# Patient Record
Sex: Male | Born: 1990 | Race: White | Hispanic: No | Marital: Married | Smoking: Never smoker
Health system: Southern US, Community
[De-identification: ages and names within clinical notes are randomized; demographics above are authoritative.]

## PROBLEM LIST (undated history)

## (undated) HISTORY — PX: OTHER SURGICAL HISTORY: SHX169

---
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
## 2009-01-20 ENCOUNTER — Emergency Department (HOSPITAL_COMMUNITY): Admission: EM | Admit: 2009-01-20 | Discharge: 2009-01-20 | Payer: Self-pay | Admitting: Emergency Medicine

## 2009-09-24 ENCOUNTER — Emergency Department (HOSPITAL_COMMUNITY): Admission: EM | Admit: 2009-09-24 | Discharge: 2009-09-24 | Payer: Self-pay | Admitting: Emergency Medicine

## 2010-03-21 ENCOUNTER — Emergency Department (HOSPITAL_COMMUNITY): Admission: EM | Admit: 2010-03-21 | Discharge: 2010-03-21 | Payer: Self-pay | Admitting: Emergency Medicine

## 2010-05-07 ENCOUNTER — Emergency Department (HOSPITAL_COMMUNITY): Admission: EM | Admit: 2010-05-07 | Discharge: 2010-05-07 | Payer: Self-pay | Admitting: Emergency Medicine

## 2010-12-03 ENCOUNTER — Emergency Department (HOSPITAL_COMMUNITY)
Admission: EM | Admit: 2010-12-03 | Discharge: 2010-12-03 | Payer: Self-pay | Source: Home / Self Care | Admitting: Emergency Medicine

## 2011-01-24 ENCOUNTER — Ambulatory Visit (INDEPENDENT_AMBULATORY_CARE_PROVIDER_SITE_OTHER): Payer: Self-pay

## 2011-01-24 ENCOUNTER — Inpatient Hospital Stay (INDEPENDENT_AMBULATORY_CARE_PROVIDER_SITE_OTHER)
Admission: RE | Admit: 2011-01-24 | Discharge: 2011-01-24 | Disposition: A | Discharge status: Home / Self Care | Payer: Self-pay | Source: Ambulatory Visit | Attending: Emergency Medicine | Admitting: Emergency Medicine

## 2011-01-24 DIAGNOSIS — S93609A Unspecified sprain of unspecified foot, initial encounter: Secondary | ICD-10-CM

## 2011-02-17 LAB — RAPID STREP SCREEN (MED CTR MEBANE ONLY): Streptococcus, Group A Screen (Direct): NEGATIVE

## 2011-06-19 ENCOUNTER — Emergency Department (HOSPITAL_COMMUNITY): Payer: Self-pay

## 2011-06-19 ENCOUNTER — Emergency Department (HOSPITAL_COMMUNITY)
Admission: EM | Admit: 2011-06-19 | Discharge: 2011-06-19 | Disposition: A | Discharge status: Home / Self Care | Payer: Self-pay | Attending: Emergency Medicine | Admitting: Emergency Medicine

## 2011-06-19 DIAGNOSIS — W278XXA Contact with other nonpowered hand tool, initial encounter: Secondary | ICD-10-CM | POA: Insufficient documentation

## 2011-06-19 DIAGNOSIS — M79609 Pain in unspecified limb: Secondary | ICD-10-CM | POA: Insufficient documentation

## 2011-06-19 DIAGNOSIS — Z23 Encounter for immunization: Secondary | ICD-10-CM | POA: Insufficient documentation

## 2011-06-19 DIAGNOSIS — Y990 Civilian activity done for income or pay: Secondary | ICD-10-CM | POA: Insufficient documentation

## 2011-06-19 DIAGNOSIS — Y9269 Other specified industrial and construction area as the place of occurrence of the external cause: Secondary | ICD-10-CM | POA: Insufficient documentation

## 2011-06-19 DIAGNOSIS — S61409A Unspecified open wound of unspecified hand, initial encounter: Secondary | ICD-10-CM | POA: Insufficient documentation

## 2011-06-23 ENCOUNTER — Emergency Department (HOSPITAL_COMMUNITY)
Admission: EM | Admit: 2011-06-23 | Discharge: 2011-06-24 | Disposition: A | Discharge status: Home / Self Care | Payer: Worker's Compensation | Attending: Emergency Medicine | Admitting: Emergency Medicine

## 2011-06-23 DIAGNOSIS — M2550 Pain in unspecified joint: Secondary | ICD-10-CM | POA: Insufficient documentation

## 2011-09-28 ENCOUNTER — Emergency Department (HOSPITAL_COMMUNITY)
Admission: EM | Admit: 2011-09-28 | Discharge: 2011-09-28 | Disposition: A | Discharge status: Home / Self Care | Payer: Self-pay | Attending: Emergency Medicine | Admitting: Emergency Medicine

## 2011-09-28 ENCOUNTER — Encounter: Payer: Self-pay | Admitting: Emergency Medicine

## 2011-09-28 ENCOUNTER — Emergency Department (HOSPITAL_COMMUNITY): Payer: Self-pay

## 2011-09-28 DIAGNOSIS — B9789 Other viral agents as the cause of diseases classified elsewhere: Secondary | ICD-10-CM | POA: Insufficient documentation

## 2011-09-28 DIAGNOSIS — R070 Pain in throat: Secondary | ICD-10-CM | POA: Insufficient documentation

## 2011-09-28 DIAGNOSIS — J069 Acute upper respiratory infection, unspecified: Secondary | ICD-10-CM | POA: Insufficient documentation

## 2011-09-28 MED ORDER — IBUPROFEN 800 MG PO TABS: 800.0000 mg | ORAL_TABLET | Freq: Three times a day (TID) | ORAL | Status: DC

## 2011-09-28 MED ORDER — PREDNISONE 10 MG PO TABS: 20.0000 mg | ORAL_TABLET | Freq: Two times a day (BID) | ORAL | Status: AC

## 2011-09-28 MED ORDER — ALBUTEROL SULFATE HFA 108 (90 BASE) MCG/ACT IN AERS
2.0000 | INHALATION_SPRAY | Freq: Once | RESPIRATORY_TRACT | Status: AC
  Administered 2011-09-28: 2 via RESPIRATORY_TRACT
  Filled 2011-09-28: qty 6.7

## 2011-09-28 MED ORDER — IBUPROFEN 800 MG PO TABS: 800.0000 mg | ORAL_TABLET | Freq: Three times a day (TID) | ORAL | Status: AC

## 2011-09-28 MED ORDER — PREDNISONE 10 MG PO TABS: 20.0000 mg | ORAL_TABLET | Freq: Two times a day (BID) | ORAL | Status: DC

## 2011-09-28 NOTE — ED Notes (Signed)
Pt states he was told by a "[facility] physician" that his "tonsils were deteriorating". C/o throat pain x 1 week.

## 2011-09-28 NOTE — ED Notes (Signed)
Pt states sore throat started about 1 and 1/2 weeks ago along with light headedness and cough.

## 2011-09-28 NOTE — ED Provider Notes (Signed)
History     CSN: [id number] Arrival date & time: 09/28/2011  6:52 PM   First MD Initiated Contact with Patient 09/28/11 1942      Chief Complaint  Patient presents with  . Sore Throat    (Consider location/radiation/quality/duration/timing/severity/associated sxs/prior treatment) HPI History provided by pt.   Pt c/o sore throat and productive cough x 1.5wks.  Cough improved but then worsened again.  Small streak of blood in sputum today.  Has also had chills and bodyaches.  Denies nasal congestion and rhinorrhea.  No known sick contacts.  Seems to have the same sx every year around this time and has been attributed to tonsillitis.  No other PMH.  History reviewed. No pertinent past medical history.  Past Surgical History  Procedure Date  . Right wrist surgery     Family History  Problem Relation Age of Onset  . Hypertension Mother   . Hypertension Father   . Hypertension Brother     History  Substance Use Topics  . Smoking status: Never Smoker   . Smokeless tobacco: Not on file  . Alcohol Use: No      Review of Systems  All other systems reviewed and are negative.    Allergies  Review of patient's allergies indicates no known allergies.  Home Medications   Current Outpatient Rx  Name Route Sig Dispense Refill  . ACETAMINOPHEN 500 MG PO TABS Oral Take 1,000 mg by mouth every 6 (six) hours as needed.        BP 156/79  Pulse 93  Temp(Src) 98.7 F (37.1 C) (Oral)  Resp 16  Ht 5\' 6"  (1.676 m)  Wt 181 lb (82.101 kg)  BMI 29.21 kg/m2  SpO2 100%  Physical Exam  Nursing note and vitals reviewed. Constitutional: He is oriented to person, place, and time. He appears well-developed and well-nourished. No distress.  HENT:  Head: No trismus in the jaw.  Right Ear: Tympanic membrane, external ear and ear canal normal.  Left Ear: Tympanic membrane, external ear and ear canal normal.  Mouth/Throat: Uvula is midline and mucous membranes are normal. No posterior  oropharyngeal edema or posterior oropharyngeal erythema.       Erythema of bilateral tonsils, hard palate and posterior pharynx.  Exudate left tonsil.   Eyes:       Normal appearance  Neck: Normal range of motion. Neck supple.  Cardiovascular: Normal rate and regular rhythm.   Pulmonary/Chest: Effort normal and breath sounds normal.  Lymphadenopathy:    He has cervical adenopathy.  Neurological: He is alert and oriented to person, place, and time.  Skin: Skin is warm and dry. No rash noted.  Psychiatric: He has a normal mood and affect. His behavior is normal.    ED Course  Procedures (including critical care time)   Labs Reviewed  RAPID STREP SCREEN   Dg Chest 2 View  09/28/2011  *RADIOLOGY REPORT*  Clinical Data: Cough  CHEST - 2 VIEW  Comparison: [employer] CT chest dated 09/24/2009.  Findings: Lungs are essentially clear. No pleural effusion or pneumothorax.  Cardiomediastinal silhouette is within normal limits.  Visualized osseous structures are within normal limits.  IMPRESSION: No evidence of acute cardiopulmonary disease.  Original Report Authenticated By: Charline Bills, M.D.     1. Viral upper respiratory tract infection       MDM  Pt presents w/ sore throat and cough x 1.5wks.  On exam, afebrile, VS w/in nml limits, lungs clear, tonsillar erythema and exudate.  CXR  obtained d/t duration of cough and because its getting worse.  Neg for pneumonia.  Strep screen neg as well.  Results discussed w/ pt.  Recieved an albuterol inhaler in ED and d/c'd home w/ ibuprofen and recommendations for conservative therapy of viral URI.  He has a PCP to f/u with.          Otilio Miu, [place] 09/29/11 1106

## 2011-09-29 NOTE — ED Provider Notes (Signed)
Medical screening examination/treatment/procedure(s) were performed by non-physician practitioner and as supervising physician I was immediately available for consultation/collaboration.    Nelia Shi, MD 09/29/11 [phone]

## 2012-09-08 ENCOUNTER — Encounter (HOSPITAL_COMMUNITY): Payer: Self-pay | Admitting: Nurse Practitioner

## 2012-09-08 ENCOUNTER — Emergency Department (HOSPITAL_COMMUNITY)
Admission: EM | Admit: 2012-09-08 | Discharge: 2012-09-08 | Disposition: A | Discharge status: Home / Self Care | Payer: Self-pay | Attending: Emergency Medicine | Admitting: Emergency Medicine

## 2012-09-08 ENCOUNTER — Emergency Department (HOSPITAL_COMMUNITY): Payer: Self-pay

## 2012-09-08 DIAGNOSIS — B349 Viral infection, unspecified: Secondary | ICD-10-CM

## 2012-09-08 DIAGNOSIS — R42 Dizziness and giddiness: Secondary | ICD-10-CM | POA: Insufficient documentation

## 2012-09-08 DIAGNOSIS — R51 Headache: Secondary | ICD-10-CM

## 2012-09-08 DIAGNOSIS — B9789 Other viral agents as the cause of diseases classified elsewhere: Secondary | ICD-10-CM | POA: Insufficient documentation

## 2012-09-08 DIAGNOSIS — R509 Fever, unspecified: Secondary | ICD-10-CM | POA: Insufficient documentation

## 2012-09-08 DIAGNOSIS — R5381 Other malaise: Secondary | ICD-10-CM | POA: Insufficient documentation

## 2012-09-08 DIAGNOSIS — S139XXA Sprain of joints and ligaments of unspecified parts of neck, initial encounter: Secondary | ICD-10-CM | POA: Insufficient documentation

## 2012-09-08 DIAGNOSIS — Y9389 Activity, other specified: Secondary | ICD-10-CM | POA: Insufficient documentation

## 2012-09-08 DIAGNOSIS — R296 Repeated falls: Secondary | ICD-10-CM | POA: Insufficient documentation

## 2012-09-08 DIAGNOSIS — S161XXA Strain of muscle, fascia and tendon at neck level, initial encounter: Secondary | ICD-10-CM

## 2012-09-08 DIAGNOSIS — R5383 Other fatigue: Secondary | ICD-10-CM | POA: Insufficient documentation

## 2012-09-08 DIAGNOSIS — Y92009 Unspecified place in unspecified non-institutional (private) residence as the place of occurrence of the external cause: Secondary | ICD-10-CM | POA: Insufficient documentation

## 2012-09-08 LAB — CBC WITH DIFFERENTIAL/PLATELET
Basophils Relative: 0 % (ref 0–1)
Eosinophils Absolute: 0.2 10*3/uL (ref 0.0–0.7)
Eosinophils Relative: 2 % (ref 0–5)
Hemoglobin: 14.9 g/dL (ref 13.0–17.0)
Lymphs Abs: 1.4 10*3/uL (ref 0.7–4.0)
MCH: 30.5 pg (ref 26.0–34.0)
MCHC: 35.6 g/dL (ref 30.0–36.0)
MCV: 85.5 fL (ref 78.0–100.0)
Monocytes Absolute: 1.1 10*3/uL — ABNORMAL HIGH (ref 0.1–1.0)
Monocytes Relative: 16 % — ABNORMAL HIGH (ref 3–12)
Neutrophils Relative %: 62 % (ref 43–77)
RBC: 4.89 MIL/uL (ref 4.22–5.81)

## 2012-09-08 LAB — BASIC METABOLIC PANEL
BUN: 11 mg/dL (ref 6–23)
Calcium: 9.7 mg/dL (ref 8.4–10.5)
Creatinine, Ser: 0.93 mg/dL (ref 0.50–1.35)
GFR calc Af Amer: >90 mL/min (ref >90)
GFR calc non Af Amer: >90 mL/min (ref >90)
Glucose, Bld: 93 mg/dL (ref 70–99)
Potassium: 3.5 mEq/L (ref 3.5–5.1)

## 2012-09-08 MED ORDER — SODIUM CHLORIDE 0.9 % IV BOLUS (SEPSIS)
1000.0000 mL | Freq: Once | INTRAVENOUS | Status: AC
  Administered 2012-09-08: 1000 mL via INTRAVENOUS

## 2012-09-08 MED ORDER — HYDROCODONE-ACETAMINOPHEN 5-500 MG PO TABS: 1.0000 | ORAL_TABLET | Freq: Four times a day (QID) | ORAL | Status: DC | PRN

## 2012-09-08 MED ORDER — METHOCARBAMOL 500 MG PO TABS: 500.0000 mg | ORAL_TABLET | Freq: Two times a day (BID) | ORAL | Status: DC

## 2012-09-08 MED ORDER — NAPROXEN 500 MG PO TABS: 500.0000 mg | ORAL_TABLET | Freq: Two times a day (BID) | ORAL | Status: DC

## 2012-09-08 NOTE — ED Provider Notes (Signed)
History     CSN: [id number]  Arrival date & time 09/08/12  1636   First MD Initiated Contact with Patient 09/08/12 1926      Chief Complaint  Patient presents with  . Headache    (Consider location/radiation/quality/duration/timing/severity/associated sxs/prior treatment) The history is provided by the patient, medical records and the spouse.    Stuart Martinez is a 21 y.o. male presents the emergency department complaining of headache.  patient has associated fatigue, dizziness, dialysis.  This began gradually for the last week, have been persistent, gradually worsening  since late that he sleeps 8 hours each night, has no trouble falling asleep or staying asleep and yet he feels sleepy yesterday and today.  He states he was very sleepy while driving the car today and he became concerned he would falsely behind the wheel.  Patient complains of neck pain. He denies trauma to his neck.  He states he was working in the yard cutting trees on Saturday when he fell landing on his buttocks; he denies neck pain at that time. The neck pain did not start until Wednesday.  Palpation and movement of the neck makes the neck worse, Tylenol makes it better.  He denies chest pain, shortness of breath, abdominal pain, nausea, vomiting, diarrhea, rash, weakness, dizziness, loss of sensation.  Patient states he presents here today because he is concerned about having meningitis.  No exposure to anyone with illness.  He denies IV drug use.  History reviewed. No pertinent past medical history.  Past Surgical History  Procedure Date  . Right wrist surgery     Family History  Problem Relation Age of Onset  . Hypertension Mother   . Hypertension Father   . Hypertension Brother     History  Substance Use Topics  . Smoking status: Never Smoker   . Smokeless tobacco: Not on file  . Alcohol Use: No      Review of Systems  Constitutional: Positive for fever and fatigue. Negative for diaphoresis,  appetite change and unexpected weight change.  HENT: Positive for neck pain. Negative for mouth sores and neck stiffness.   Eyes: Negative for visual disturbance.  Respiratory: Negative for cough, chest tightness, shortness of breath and wheezing.   Cardiovascular: Negative for chest pain.  Gastrointestinal: Negative for nausea, vomiting, abdominal pain, diarrhea and constipation.  Genitourinary: Negative for dysuria, urgency, frequency and hematuria.  Musculoskeletal: Positive for myalgias. Negative for back pain.  Skin: Negative for rash.  Neurological: Positive for headaches. Negative for syncope and light-headedness.  Psychiatric/Behavioral: Negative for disturbed wake/sleep cycle. The patient is not nervous/anxious.     Allergies  Review of patient's allergies indicates no known allergies.  Home Medications   Current Outpatient Rx  Name Route Sig Dispense Refill  . ACETAMINOPHEN 500 MG PO TABS Oral Take 1,000 mg by mouth every 6 (six) hours as needed. For pain or fever    . HYDROCODONE-ACETAMINOPHEN 5-500 MG PO TABS Oral Take 1 tablet by mouth every 6 (six) hours as needed for pain. 30 tablet 0  . METHOCARBAMOL 500 MG PO TABS Oral Take 1 tablet (500 mg total) by mouth 2 (two) times daily. 20 tablet 0  . NAPROXEN 500 MG PO TABS Oral Take 1 tablet (500 mg total) by mouth 2 (two) times daily with a meal. 30 tablet 0    BP 121/62  Pulse 94  Temp 99.4 F (37.4 C) (Oral)  Resp 16  SpO2 99%  Physical Exam  Nursing note and vitals  reviewed. Constitutional: He is oriented to person, place, and time. He appears well-developed and well-nourished. No distress.  HENT:  Head: Normocephalic and atraumatic.  Right Ear: Tympanic membrane, external ear and ear canal normal.  Nose: Nose normal. Right sinus exhibits no maxillary sinus tenderness and no frontal sinus tenderness. Left sinus exhibits no maxillary sinus tenderness and no frontal sinus tenderness.  Mouth/Throat: Uvula is midline,  oropharynx is clear and moist and mucous membranes are normal. No oropharyngeal exudate.  Eyes: Conjunctivae normal and EOM are normal. Pupils are equal, round, and reactive to light. No scleral icterus.  Neck: Phonation normal. Neck supple. No JVD present. Spinous process tenderness and muscular tenderness present. No rigidity. No erythema present. Decreased range of motion: secondary to pain. No Brudzinski's sign and no Kernig's sign noted. No mass present.  Cardiovascular: Normal rate, regular rhythm, normal heart sounds and intact distal pulses.  Exam reveals no gallop and no friction rub.   No murmur heard. Pulmonary/Chest: Effort normal and breath sounds normal. No stridor. No respiratory distress. He has no wheezes.  Abdominal: Soft. Bowel sounds are normal. He exhibits no mass. There is no tenderness. There is no rebound and no guarding.  Musculoskeletal: Normal range of motion. He exhibits no edema and no tenderness.  Lymphadenopathy:    He has no cervical adenopathy.  Neurological: He is alert and oriented to person, place, and time. He has normal reflexes. He exhibits normal muscle tone. Coordination normal.       Speech is clear and goal oriented, follows commands Major Cranial nerves without deficit, no facial droop Normal strength in upper and lower extremities bilaterally including dorsiflexion and plantar flexion, strong and equal grip strength Sensation normal to light and sharp touch Moves extremities without ataxia, coordination intact Normal finger to nose and rapid alternating movements Neg romberg, no pronator drift Normal gait Normal heel-shin and balance  Skin: Skin is warm and dry. No rash noted. He is not diaphoretic.       No petechial or purpuric rash noted  Psychiatric: He has a normal mood and affect.    ED Course  Procedures (including critical care time)  Labs Reviewed  CBC WITH DIFFERENTIAL - Abnormal; Notable for the following:    Monocytes Relative 16  (*)     Monocytes Absolute 1.1 (*)     All other components within normal limits  BASIC METABOLIC PANEL   Dg Cervical Spine Complete  09/08/2012  *RADIOLOGY REPORT*  Clinical Data: Headache.  The posterior neck pain.  CERVICAL SPINE - COMPLETE 4+ VIEW  Comparison: None  Findings: No fracture or malalignment.  Prevertebral soft tissues are normal.  Disc spaces well maintained.  Cervicothoracic junction normal.  IMPRESSION: No acute findings.   Original Report Authenticated By: Charlett Nose, M.D.      1. Viral syndrome   2. Headache   3. Neck muscle strain       MDM  Perry Mount presents with headache, myalgias, neck pain.  Patient's symptoms not consistent with meningitis; negative Kernig's and Brudzinski's sign.  He is alert, oriented, in no apparent distress, nontoxic, nonseptic appearing.  Patient headache resolved with fluid administration and ibuprofen.  Discussed all these findings with the patient. Also recommended that he followup with his primary care physician if he is not improved by Monday.  I have also discussed reasons to return immediately to the ER.  Patient expresses understanding and agrees with plan.  1. Medications: Naprosyn, and Robaxin, Vicodin 2. Treatment: Rest, use  Tylenol for fever early, drink plenty of fluids, gentle stretching of the neck 3. Follow Up: Primary care physician if no improvement by Monday       [facility], PA-C 09/08/12 2222

## 2012-09-08 NOTE — ED Notes (Signed)
C/o headaches, neck pain, fatigue, dizziness over past week. States he almost fell asleep while driving yesterday. Reports he sleeps 8 hours each night. Denies recent falls or trauma. Denies history of same. A&Ox4, resp e/u

## 2012-09-10 NOTE — ED Provider Notes (Signed)
Medical screening examination/treatment/procedure(s) were conducted as a shared visit with non-physician practitioner(s) and myself.  I personally evaluated the patient during the encounter  Gradual onset headache with increasing fatigue and body aches. Subjective fever at home.   Nontoxic, normal neuro exam, no meningismus.  Glynn Octave, MD 09/10/12 1017

## 2014-04-01 ENCOUNTER — Emergency Department (HOSPITAL_COMMUNITY)
Admission: EM | Admit: 2014-04-01 | Discharge: 2014-04-01 | Disposition: A | Discharge status: Home / Self Care | Payer: 59 | Source: Home / Self Care | Attending: Family Medicine | Admitting: Family Medicine

## 2014-04-01 ENCOUNTER — Encounter (HOSPITAL_COMMUNITY): Payer: Self-pay | Admitting: Emergency Medicine

## 2014-04-01 DIAGNOSIS — L249 Irritant contact dermatitis, unspecified cause: Secondary | ICD-10-CM

## 2014-04-01 DIAGNOSIS — L259 Unspecified contact dermatitis, unspecified cause: Secondary | ICD-10-CM

## 2014-04-01 MED ORDER — BETAMETHASONE DIPROPIONATE AUG 0.05 % EX CREA: TOPICAL_CREAM | Freq: Two times a day (BID) | CUTANEOUS | Status: DC

## 2014-04-01 NOTE — ED Provider Notes (Signed)
CSN: [id number]     Arrival date & time 04/01/14  1916 History   First MD Initiated Contact with Patient 04/01/14 2011     Chief Complaint  Patient presents with  . Rash   (Consider location/radiation/quality/duration/timing/severity/associated sxs/prior Treatment) HPI Comments: 23 year old male presents complaining of a rash on his arms for one week. He has a itchy red rash that is raised on his bilateral forearms. He has been applying calamine lotion which helps temporarily but overall the rash is still getting worse. It is starting to spread down to the tops of his fingers as well. He denies any swelling or numbness in the fingers. He thinks he got some poison ivy on his arms but does not know why it is not getting better. He has no fever, chills, or any other systemic symptoms.  Patient is a 23 y.o. male presenting with rash.  Rash   History reviewed. No pertinent past medical history. Past Surgical History  Procedure Laterality Date  . Right wrist surgery     Family History  Problem Relation Age of Onset  . Hypertension Mother   . Hypertension Father   . Hypertension Brother    History  Substance Use Topics  . Smoking status: Never Smoker   . Smokeless tobacco: Not on file  . Alcohol Use: No    Review of Systems  Skin: Positive for rash.  All other systems reviewed and are negative.   Allergies  Review of patient's allergies indicates no known allergies.  Home Medications   Prior to Admission medications   Medication Sig Start Date End Date Taking? Authorizing Provider  acetaminophen (TYLENOL) 500 MG tablet Take 1,000 mg by mouth every 6 (six) hours as needed. For pain or fever    Historical Provider, MD  augmented betamethasone dipropionate (DIPROLENE AF) 0.05 % cream Apply topically 2 (two) times daily. 04/01/14   Graylon Good, PA-C  HYDROcodone-acetaminophen (VICODIN) 5-500 MG per tablet Take 1 tablet by mouth every 6 (six) hours as needed for pain. 09/08/12    Hannah Muthersbaugh, PA-C  methocarbamol (ROBAXIN) 500 MG tablet Take 1 tablet (500 mg total) by mouth 2 (two) times daily. 09/08/12   Hannah Muthersbaugh, PA-C  naproxen (NAPROSYN) 500 MG tablet Take 1 tablet (500 mg total) by mouth 2 (two) times daily with a meal. 09/08/12   Hannah Muthersbaugh, PA-C   BP 123/73  Pulse 74  Temp(Src) 98.8 F (37.1 C) (Oral)  SpO2 99% Physical Exam  Nursing note and vitals reviewed. Constitutional: He is oriented to person, place, and time. He appears well-developed and well-nourished. No distress.  HENT:  Head: Normocephalic and atraumatic.  Pulmonary/Chest: Effort normal. No respiratory distress.  Neurological: He is alert and oriented to person, place, and time. Coordination normal.  Skin: Skin is warm and dry. Rash noted. Rash is urticarial (raised, erythematous rash on the bilateral forearms and dorsum of hands). He is not diaphoretic.  Psychiatric: He has a normal mood and affect. Judgment normal.    ED Course  Procedures (including critical care time) Labs Review Labs Reviewed - No data to display  Imaging Review No results found.   MDM   1. Irritant contact dermatitis    Treat with corticosteroid cream.  F/u if no improvement in a few days    Meds ordered this encounter  Medications  . augmented betamethasone dipropionate (DIPROLENE AF) 0.05 % cream    Sig: Apply topically 2 (two) times daily.    Dispense:  50 g  Refill:  1    Order Specific Question:  Supervising Provider    Answer:  Bradd CanaryKINDL, JAMES D [[id number]]       Graylon GoodZachary H Emilian Stawicki, PA-C 04/01/14 2122

## 2014-04-01 NOTE — Discharge Instructions (Signed)

## 2014-04-01 NOTE — ED Notes (Signed)
C/o  Possible poison oak rash on arms x 1 wk.  States gradually getting worse and spreading.  Mild relief with otc calamine lotion.

## 2014-04-04 NOTE — ED Provider Notes (Signed)
Medical screening examination/treatment/procedure(s) were performed by resident physician or non-physician practitioner and as supervising physician I was immediately available for consultation/collaboration.   KINDL,JAMES DOUGLAS MD.   James D Kindl, MD 04/04/14 1007 

## 2014-04-20 ENCOUNTER — Emergency Department (INDEPENDENT_AMBULATORY_CARE_PROVIDER_SITE_OTHER)
Admission: EM | Admit: 2014-04-20 | Discharge: 2014-04-20 | Disposition: A | Discharge status: Home / Self Care | Payer: 59 | Source: Home / Self Care | Attending: Emergency Medicine | Admitting: Emergency Medicine

## 2014-04-20 ENCOUNTER — Encounter (HOSPITAL_COMMUNITY): Payer: Self-pay | Admitting: Emergency Medicine

## 2014-04-20 DIAGNOSIS — K219 Gastro-esophageal reflux disease without esophagitis: Secondary | ICD-10-CM

## 2014-04-20 DIAGNOSIS — J9801 Acute bronchospasm: Secondary | ICD-10-CM

## 2014-04-20 MED ORDER — FAMOTIDINE 20 MG PO TABS: 20.0000 mg | ORAL_TABLET | Freq: Two times a day (BID) | ORAL | Status: DC

## 2014-04-20 MED ORDER — GI COCKTAIL ~~LOC~~
ORAL | Status: AC
  Filled 2014-04-20: qty 30

## 2014-04-20 MED ORDER — ALBUTEROL SULFATE (2.5 MG/3ML) 0.083% IN NEBU
5.0000 mg | INHALATION_SOLUTION | Freq: Once | RESPIRATORY_TRACT | Status: AC
  Administered 2014-04-20: 5 mg via RESPIRATORY_TRACT

## 2014-04-20 MED ORDER — GI COCKTAIL ~~LOC~~
30.0000 mL | Freq: Once | ORAL | Status: AC
  Administered 2014-04-20: 30 mL via ORAL

## 2014-04-20 MED ORDER — IPRATROPIUM BROMIDE 0.02 % IN SOLN
RESPIRATORY_TRACT | Status: AC
  Filled 2014-04-20: qty 2.5

## 2014-04-20 MED ORDER — FAMOTIDINE 20 MG PO TABS
ORAL_TABLET | ORAL | Status: AC
  Filled 2014-04-20: qty 1

## 2014-04-20 MED ORDER — FAMOTIDINE 20 MG PO TABS
20.0000 mg | ORAL_TABLET | Freq: Once | ORAL | Status: AC
  Administered 2014-04-20: 20 mg via ORAL

## 2014-04-20 MED ORDER — ALBUTEROL SULFATE HFA 108 (90 BASE) MCG/ACT IN AERS: 1.0000 | INHALATION_SPRAY | Freq: Four times a day (QID) | RESPIRATORY_TRACT | Status: AC | PRN

## 2014-04-20 MED ORDER — IPRATROPIUM BROMIDE 0.02 % IN SOLN
0.5000 mg | Freq: Once | RESPIRATORY_TRACT | Status: AC
  Administered 2014-04-20: 0.5 mg via RESPIRATORY_TRACT

## 2014-04-20 MED ORDER — ALBUTEROL SULFATE (2.5 MG/3ML) 0.083% IN NEBU
INHALATION_SOLUTION | RESPIRATORY_TRACT | Status: AC
  Filled 2014-04-20: qty 6

## 2014-04-20 NOTE — Discharge Instructions (Signed)
Please use medications as directed and if symptoms become suddenly worse or severe, have yourself re-evaluated at your nearest ER. If symptoms do not begin to improve, please follow up with your doctor.  Bronchospasm, Adult A bronchospasm is a spasm or tightening of the airways going into the lungs. During a bronchospasm breathing becomes more difficult because the airways get smaller. When this happens there can be coughing, a whistling sound when breathing (wheezing), and difficulty breathing. Bronchospasm is often associated with asthma, but not all patients who experience a bronchospasm have asthma. CAUSES  A bronchospasm is caused by inflammation or irritation of the airways. The inflammation or irritation may be triggered by:   Allergies (such as to animals, pollen, food, or mold). Allergens that cause bronchospasm may cause wheezing immediately after exposure or many hours later.   Infection. Viral infections are believed to be the most common cause of bronchospasm.   Exercise.   Irritants (such as pollution, cigarette smoke, strong odors, aerosol sprays, and paint fumes).   Weather changes. Winds increase molds and pollens in the air. Rain refreshes the air by washing irritants out. Cold air may cause inflammation.   Stress and emotional upset.  SIGNS AND SYMPTOMS   Wheezing.   Excessive nighttime coughing.   Frequent or severe coughing with a simple cold.   Chest tightness.   Shortness of breath.  DIAGNOSIS  Bronchospasm is usually diagnosed through a history and physical exam. Tests, such as chest X-rays, are sometimes done to look for other conditions. TREATMENT   Inhaled medicines can be given to open up your airways and help you breathe. The medicines can be given using either an inhaler or a nebulizer machine.  Corticosteroid medicines may be given for severe bronchospasm, usually when it is associated with asthma. HOME CARE INSTRUCTIONS   Always have a  plan prepared for seeking medical care. Know when to call your health care provider and local emergency services (911 in the [place] Know where you can access local emergency care.  Only take medicines as directed by your health care provider.  If you were prescribed an inhaler or nebulizer machine, ask your health care provider to explain how to use it correctly. Always use a spacer with your inhaler if you were given one.  It is necessary to remain calm during an attack. Try to relax and breathe more slowly.  Control your home environment in the following ways:   Change your heating and air conditioning filter at least once a month.   Limit your use of fireplaces and wood stoves.  Do not smoke and do not allow smoking in your home.   Avoid exposure to perfumes and fragrances.   Get rid of pests (such as roaches and mice) and their droppings.   Throw away plants if you see mold on them.   Keep your house clean and dust free.   Replace carpet with wood, tile, or vinyl flooring. Carpet can trap dander and dust.   Use allergy-proof pillows, mattress covers, and box spring covers.   Wash bed sheets and blankets every week in hot water and dry them in a dryer.   Use blankets that are made of polyester or cotton.   Wash hands frequently. SEEK MEDICAL CARE IF:   You have muscle aches.   You have chest pain.   The sputum changes from clear or white to yellow, green, gray, or bloody.   The sputum you cough up gets thicker.   There are  problems that may be related to the medicine you are given, such as a rash, itching, swelling, or trouble breathing.  SEEK IMMEDIATE MEDICAL CARE IF:   You have worsening wheezing and coughing even after taking your prescribed medicines.   You have increased difficulty breathing.   You develop severe chest pain. MAKE SURE YOU:   Understand these instructions.  Will watch your condition.  Will get help right away if you  are not doing well or get worse. Document Released: 10/27/2003 Document Revised: 06/26/2013 Document Reviewed: 04/15/2013 [facility] Patient Information 2014 [place], [place].  Diet for Gastroesophageal Reflux Disease, Adult Reflux (acid reflux) is when acid from your stomach flows up into the esophagus. When acid comes in contact with the esophagus, the acid causes irritation and soreness (inflammation) in the esophagus. When reflux happens often or so severely that it causes damage to the esophagus, it is called gastroesophageal reflux disease (GERD). Nutrition therapy can help ease the discomfort of GERD. FOODS OR DRINKS TO AVOID OR LIMIT  Smoking or chewing tobacco. Nicotine is one of the most potent stimulants to acid production in the gastrointestinal tract.  Caffeinated and decaffeinated coffee and black tea.  Regular or low-calorie carbonated beverages or energy drinks (caffeine-free carbonated beverages are allowed).   Strong spices, such as black pepper, white pepper, red pepper, cayenne, curry powder, and chili powder.  Peppermint or spearmint.  Chocolate.  High-fat foods, including meats and fried foods. Extra added fats including oils, butter, salad dressings, and nuts. Limit these to less than 8 tsp per day.  Fruits and vegetables if they are not tolerated, such as citrus fruits or tomatoes.  Alcohol.  Any food that seems to aggravate your condition. If you have questions regarding your diet, call your caregiver or a registered dietitian. OTHER THINGS THAT MAY HELP GERD INCLUDE:   Eating your meals slowly, in a relaxed setting.  Eating 5 to 6 small meals per day instead of 3 large meals.  Eliminating food for a period of time if it causes distress.  Not lying down until 3 hours after eating a meal.  Keeping the head of your bed raised 6 to 9 inches (15 to 23 cm) by using a foam wedge or blocks under the legs of the bed. Lying flat may make symptoms worse.  Being  physically active. Weight loss may be helpful in reducing reflux in overweight or obese adults.  Wear loose fitting clothing EXAMPLE MEAL PLAN This meal plan is approximately 2,000 calories based on https://www.bernard.org/ meal planning guidelines. Breakfast   cup cooked oatmeal.  1 cup strawberries.  1 cup low-fat milk.  1 oz almonds. Snack  1 cup cucumber slices.  6 oz yogurt (made from low-fat or fat-free milk). Lunch  2 slice whole-wheat bread.  2 oz sliced [place].  2 tsp mayonnaise.  1 cup blueberries.  1 cup snap peas. Snack  6 whole-wheat crackers.  1 oz string cheese. Dinner   cup brown rice.  1 cup mixed veggies.  1 tsp olive oil.  3 oz grilled fish. Document Released: 10/24/2005 Document Revised: 01/16/2012 Document Reviewed: 09/09/2011 [facility] Patient Information 2014 [place], [place].  Gastroesophageal Reflux Disease, Adult Gastroesophageal reflux disease (GERD) happens when acid from your stomach flows up into the esophagus. When acid comes in contact with the esophagus, the acid causes soreness (inflammation) in the esophagus. Over time, GERD may create small holes (ulcers) in the lining of the esophagus. CAUSES   Increased body weight. This puts pressure on  the stomach, making acid rise from the stomach into the esophagus.  Smoking. This increases acid production in the stomach.  Drinking alcohol. This causes decreased pressure in the lower esophageal sphincter (valve or ring of muscle between the esophagus and stomach), allowing acid from the stomach into the esophagus.  Late evening meals and a full stomach. This increases pressure and acid production in the stomach.  A malformed lower esophageal sphincter. Sometimes, no cause is found. SYMPTOMS   Burning pain in the lower part of the mid-chest behind the breastbone and in the mid-stomach area. This may occur twice a week or more often.  Trouble swallowing.  Sore throat.  Dry  cough.  Asthma-like symptoms including chest tightness, shortness of breath, or wheezing. DIAGNOSIS  Your caregiver may be able to diagnose GERD based on your symptoms. In some cases, X-rays and other tests may be done to check for complications or to check the condition of your stomach and esophagus. TREATMENT  Your caregiver may recommend over-the-counter or prescription medicines to help decrease acid production. Ask your caregiver before starting or adding any new medicines.  HOME CARE INSTRUCTIONS   Change the factors that you can control. Ask your caregiver for guidance concerning weight loss, quitting smoking, and alcohol consumption.  Avoid foods and drinks that make your symptoms worse, such as:  Caffeine or alcoholic drinks.  Chocolate.  Peppermint or mint flavorings.  Garlic and onions.  Spicy foods.  Citrus fruits, such as oranges, lemons, or limes.  Tomato-based foods such as sauce, chili, salsa, and pizza.  Fried and fatty foods.  Avoid lying down for the 3 hours prior to your bedtime or prior to taking a nap.  Eat small, frequent meals instead of large meals.  Wear loose-fitting clothing. Do not wear anything tight around your waist that causes pressure on your stomach.  Raise the head of your bed 6 to 8 inches with wood blocks to help you sleep. Extra pillows will not help.  Only take over-the-counter or prescription medicines for pain, discomfort, or fever as directed by your caregiver.  Do not take aspirin, ibuprofen, or other nonsteroidal anti-inflammatory drugs (NSAIDs). SEEK IMMEDIATE MEDICAL CARE IF:   You have pain in your arms, neck, jaw, teeth, or back.  Your pain increases or changes in intensity or duration.  You develop nausea, vomiting, or sweating (diaphoresis).  You develop shortness of breath, or you faint.  Your vomit is green, yellow, black, or looks like coffee grounds or blood.  Your stool is red, bloody, or black. These  symptoms could be signs of other problems, such as heart disease, gastric bleeding, or esophageal bleeding. MAKE SURE YOU:   Understand these instructions.  Will watch your condition.  Will get help right away if you are not doing well or get worse. Document Released: 08/03/2005 Document Revised: 01/16/2012 Document Reviewed: 05/13/2011 [facility] Patient Information 2014 [place], [place].

## 2014-04-20 NOTE — ED Notes (Signed)
Pt  Reports  Symptoms  Of  Shortness  Of  Breath         Sensation  Of      Not  Being    Able  To  Complete  A  Deep  Breath  -  He  Had  Some  Fleeting   Chest    Discomfort  This  Am  And  At this  Time  He  Is  Sitting  Upright on the  Exam table  Speaking in  Complete  sentances  And is  Appearing in no  Severe  Distress  With  Skin  That is  Warm and  Dry

## 2014-04-20 NOTE — ED Provider Notes (Signed)
CSN: [id number]     Arrival date & time 04/20/14  1752 History   First MD Initiated Contact with Patient 04/20/14 1804     Chief Complaint  Patient presents with  . Shortness of Breath   (Consider location/radiation/quality/duration/timing/severity/associated sxs/prior Treatment) HPI Comments: Patient presents with a 3 day history of increased work of breathing without associated fever or additional URI sx. Patient states is a non-smoker but does have hx of childhood asthma. Also reports some burning epigastric discomfort that radiates as a burning sensation to the middle portion of his chest. Has not attempted any medical therapies at home. Symptoms do not change with meal intake or exertion. States he simply feels as if he cannot "get a good deep breath." No pleuritic chest pain or pedal edema. PCP: Dr. Shelah LewandowskyElkin Works as a [employer]  The history is provided by the patient.    History reviewed. No pertinent past medical history. Past Surgical History  Procedure Laterality Date  . Right wrist surgery     Family History  Problem Relation Age of Onset  . Hypertension Mother   . Hypertension Father   . Hypertension Brother    History  Substance Use Topics  . Smoking status: Never Smoker   . Smokeless tobacco: Not on file  . Alcohol Use: No    Review of Systems  Constitutional: Negative.   HENT: Negative.   Eyes: Negative.   Respiratory: Positive for cough, chest tightness and shortness of breath.   Cardiovascular: Negative.   Gastrointestinal: Positive for abdominal pain. Negative for nausea, vomiting and diarrhea.  Endocrine: Negative for polydipsia, polyphagia and polyuria.  Musculoskeletal: Negative for back pain and myalgias.  Skin: Negative.     Allergies  Review of patient's allergies indicates no known allergies.  Home Medications   Prior to Admission medications   Medication Sig Start Date End Date Taking? Authorizing Provider  acetaminophen (TYLENOL) 500 MG  tablet Take 1,000 mg by mouth every 6 (six) hours as needed. For pain or fever    Historical Provider, MD  albuterol (PROVENTIL HFA;VENTOLIN HFA) 108 (90 BASE) MCG/ACT inhaler Inhale 1-2 puffs into the lungs every 6 (six) hours as needed for wheezing or shortness of breath. 04/20/14   Ardis RowanJennifer Lee Faustina Gebert, PA  augmented betamethasone dipropionate (DIPROLENE AF) 0.05 % cream Apply topically 2 (two) times daily. 04/01/14   Graylon GoodZachary H Baker, PA-C  famotidine (PEPCID) 20 MG tablet Take 1 tablet (20 mg total) by mouth 2 (two) times daily. X 14 days 04/20/14   Ardis RowanJennifer Lee Maliek Schellhorn, PA  HYDROcodone-acetaminophen (VICODIN) 5-500 MG per tablet Take 1 tablet by mouth every 6 (six) hours as needed for pain. 09/08/12   Hannah Muthersbaugh, PA-C  methocarbamol (ROBAXIN) 500 MG tablet Take 1 tablet (500 mg total) by mouth 2 (two) times daily. 09/08/12   Hannah Muthersbaugh, PA-C  naproxen (NAPROSYN) 500 MG tablet Take 1 tablet (500 mg total) by mouth 2 (two) times daily with a meal. 09/08/12   Hannah Muthersbaugh, PA-C   There were no vitals taken for this visit. Physical Exam  Nursing note and vitals reviewed. Constitutional: He is oriented to person, place, and time. He appears well-developed and well-nourished. No distress.  HENT:  Head: Normocephalic and atraumatic.  Right Ear: Hearing, tympanic membrane, external ear and ear canal normal.  Left Ear: Hearing, tympanic membrane, external ear and ear canal normal.  Nose: Nose normal.  Mouth/Throat: Uvula is midline, oropharynx is clear and moist and mucous membranes are normal.  Eyes: Conjunctivae  are normal. No scleral icterus.  Neck: Normal range of motion. Neck supple. No JVD present.  Cardiovascular: Normal rate, regular rhythm and normal heart sounds.   Pulmonary/Chest: Effort normal. No respiratory distress. He has wheezes. He has no rales. He exhibits no tenderness.  +mild to moderate end expiratory wheezes  Abdominal: Soft. Normal appearance and bowel  sounds are normal. There is no hepatosplenomegaly. There is tenderness in the epigastric area. There is no rigidity, no rebound, no guarding and no CVA tenderness. Hernia confirmed negative in the ventral area.  Musculoskeletal: Normal range of motion.  Neurological: He is alert and oriented to person, place, and time.  Skin: Skin is warm and dry.  Psychiatric: He has a normal mood and affect. His behavior is normal.    ED Course  Procedures (including critical care time) Labs Review Labs Reviewed - No data to display  Imaging Review No results found.   MDM   1. Bronchospasm   2. GERD (gastroesophageal reflux disease)    ECG: NSR @ 68 bpm without ectopy or acute ST/T wave changes.  Patient given Albuterol 5mg  and Atrovent 0.5mg  neb treatment along with pepcid 20 mg po and GI cocktail PO and reports his symptoms to have resolved. Repeat lung exam consistent with resolution of end expiratory wheezing. No hypoxia or fever. I suspect that this patient is experiencing gastritis with associated gastroesophageal reflux and this is causing inflammation and mild bronchospasm in adject bronchial structures. Will treat with Pepecid and albuterol at home and advise follow up with PCP if symptoms do not continue to improve. Patient understands that if symptoms become suddenly worse or severe, he should report to his nearest ER for evaluation.   Jess BartersJennifer Lee ArlingtonPresson, [place] 04/21/14 1550

## 2014-04-23 NOTE — ED Provider Notes (Signed)
Medical screening examination/treatment/procedure(s) were performed by non-physician practitioner and as supervising physician I was immediately available for consultation/collaboration.  Selestino Nila, M.D.  Anyiah Coverdale C Chalmers Iddings, MD 04/23/14 1333 

## 2016-11-21 ENCOUNTER — Other Ambulatory Visit: Payer: Self-pay | Admitting: Student

## 2016-11-21 ENCOUNTER — Ambulatory Visit
Admission: RE | Admit: 2016-11-21 | Discharge: 2016-11-21 | Disposition: A | Discharge status: Home / Self Care | Payer: Self-pay | Source: Ambulatory Visit | Attending: Family Medicine | Admitting: Family Medicine

## 2016-11-21 DIAGNOSIS — R079 Chest pain, unspecified: Secondary | ICD-10-CM

## 2016-11-21 DIAGNOSIS — R0602 Shortness of breath: Secondary | ICD-10-CM

## 2017-10-17 IMAGING — CR DG CHEST 2V
2 series · 2 of 2 positions shown · non-contrast
Comparison: 09/28/2011

CLINICAL DATA: Asthma, acute onset shortness of breath and chest
pain last night

EXAM:
CHEST  2 VIEW

[w chest pa]
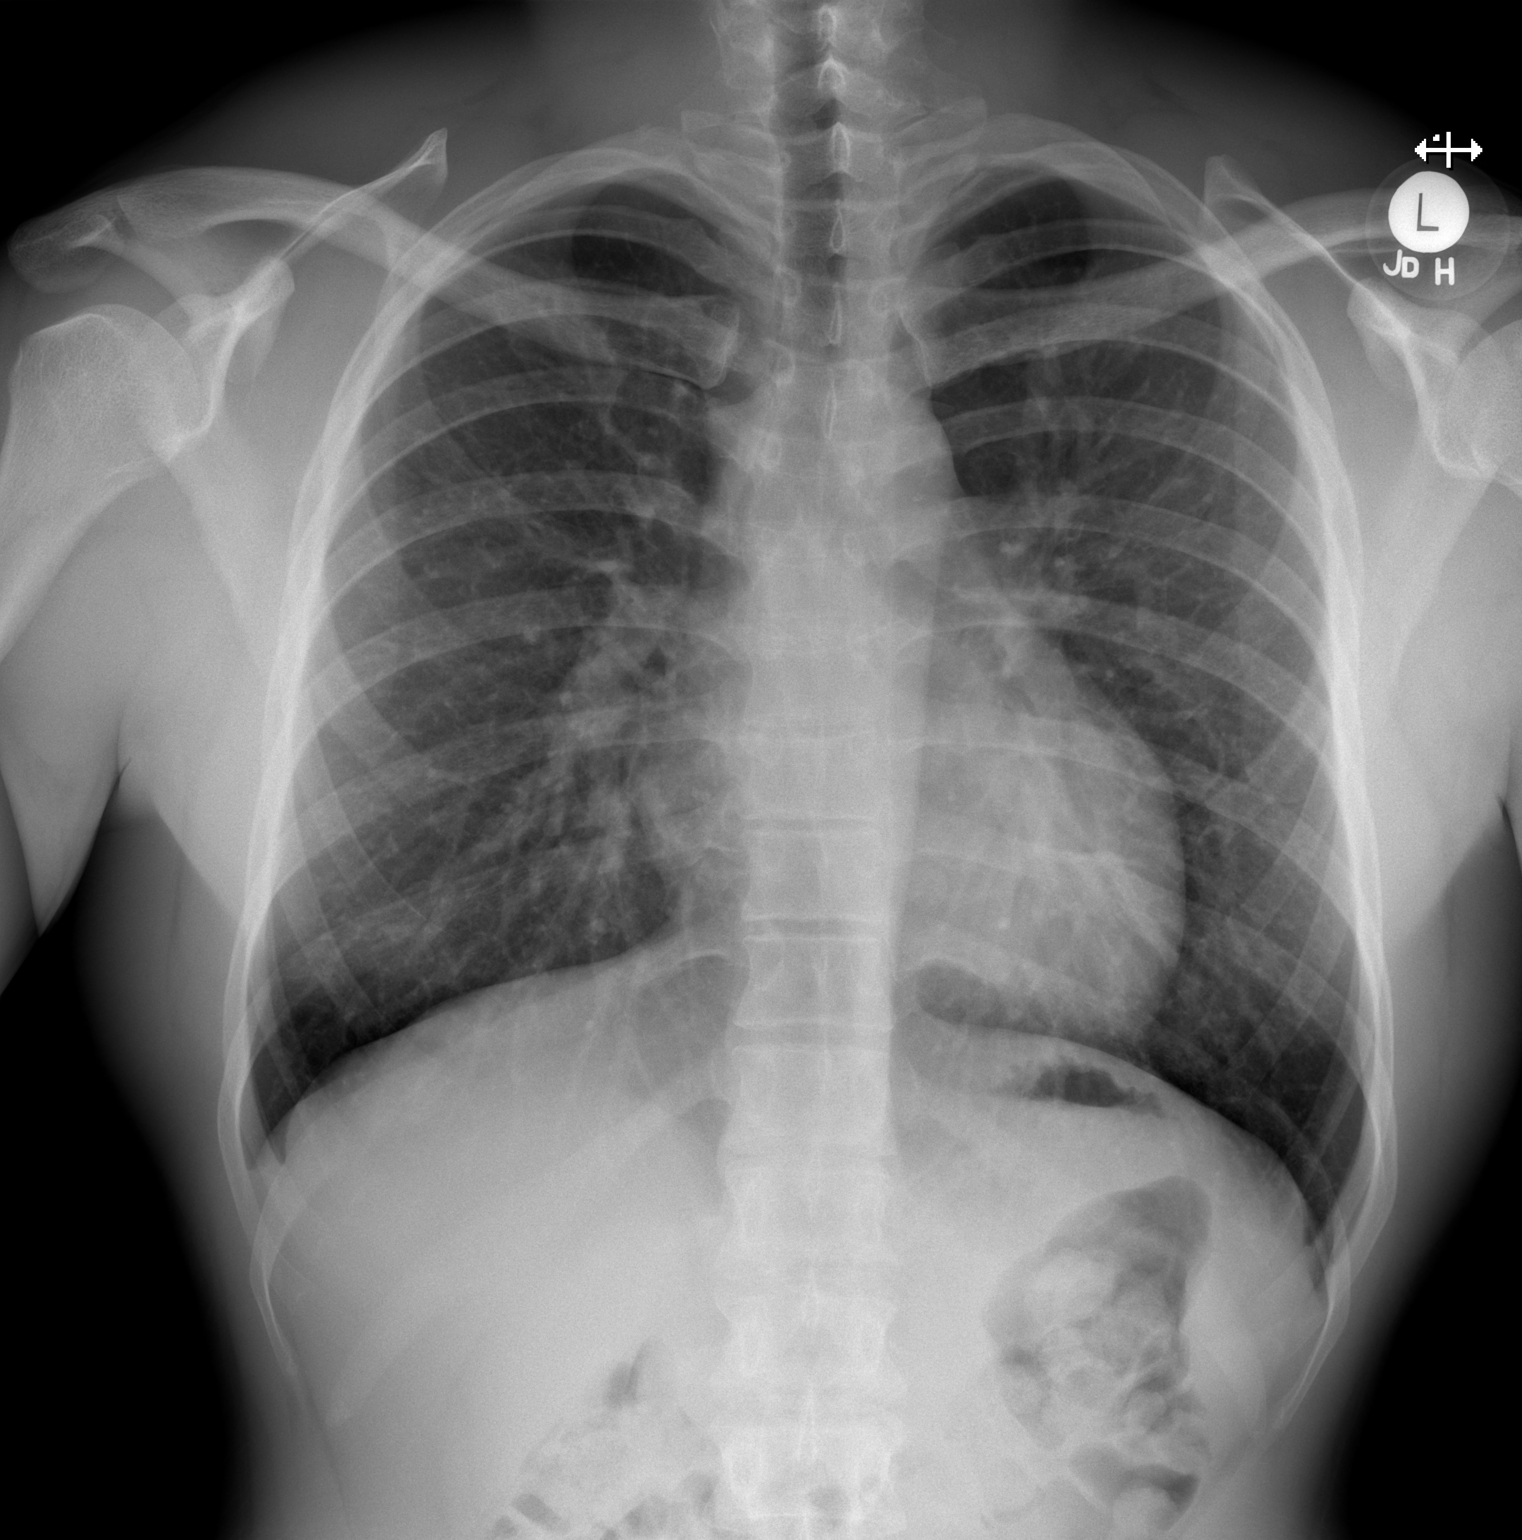

[w chest lat]
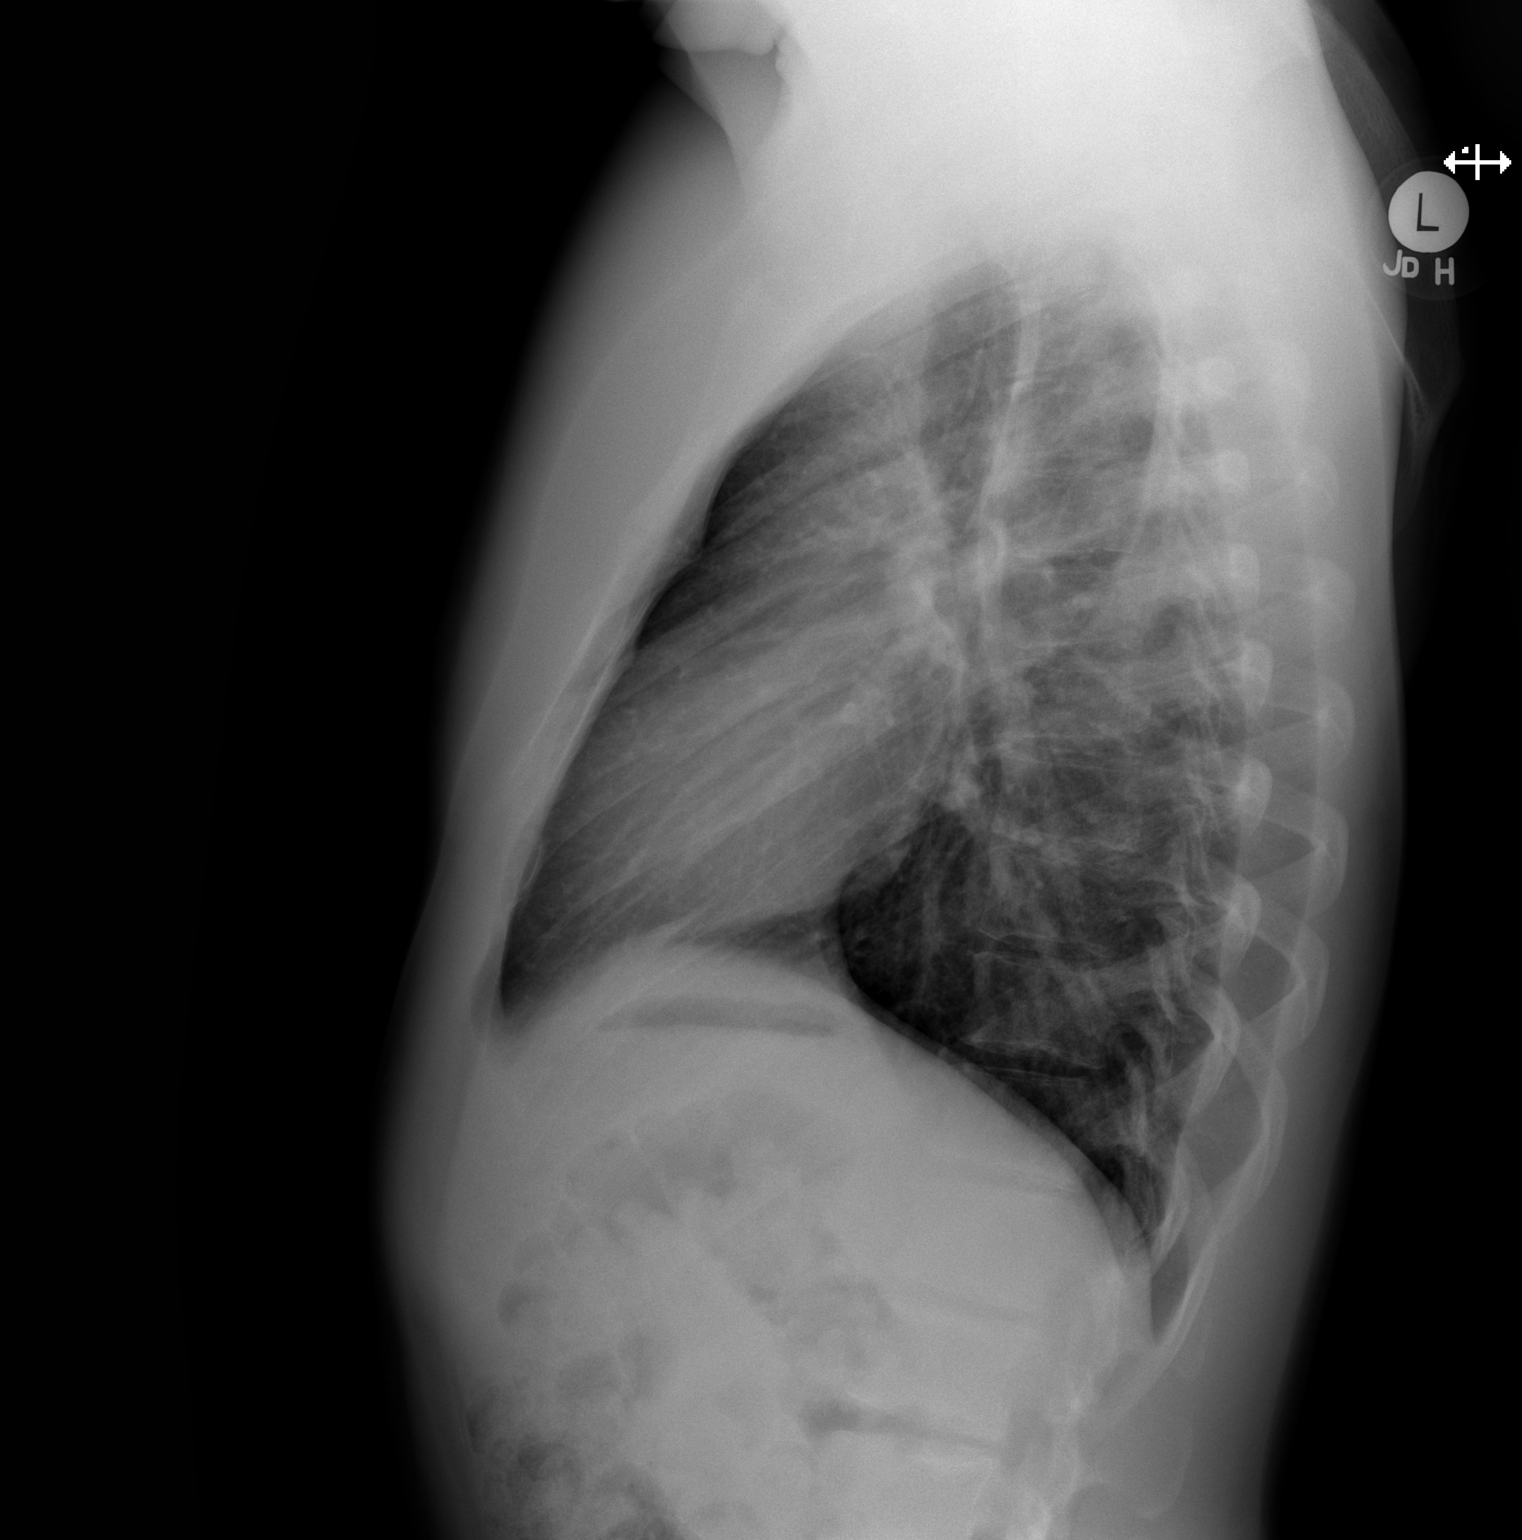

[2 of 2 positions shown; findings below may reference images not displayed]

FINDINGS: Normal heart size, mediastinal contours, and pulmonary vascularity.

Lungs clear.

No pleural effusion or pneumothorax.

Bones unremarkable.
IMPRESSION: Normal exam.

## 2017-10-20 ENCOUNTER — Ambulatory Visit (INDEPENDENT_AMBULATORY_CARE_PROVIDER_SITE_OTHER): Payer: 59 | Admitting: Sports Medicine

## 2017-10-20 ENCOUNTER — Encounter: Payer: Self-pay | Admitting: Sports Medicine

## 2017-10-20 ENCOUNTER — Ambulatory Visit (INDEPENDENT_AMBULATORY_CARE_PROVIDER_SITE_OTHER): Payer: 59

## 2017-10-20 DIAGNOSIS — M7661 Achilles tendinitis, right leg: Secondary | ICD-10-CM

## 2017-10-20 DIAGNOSIS — M722 Plantar fascial fibromatosis: Secondary | ICD-10-CM | POA: Diagnosis not present

## 2017-10-20 DIAGNOSIS — M2142 Flat foot [pes planus] (acquired), left foot: Secondary | ICD-10-CM | POA: Diagnosis not present

## 2017-10-20 DIAGNOSIS — M2141 Flat foot [pes planus] (acquired), right foot: Secondary | ICD-10-CM

## 2017-10-20 DIAGNOSIS — M7662 Achilles tendinitis, left leg: Secondary | ICD-10-CM | POA: Diagnosis not present

## 2017-10-20 DIAGNOSIS — M6700 Short Achilles tendon (acquired), unspecified ankle: Secondary | ICD-10-CM | POA: Diagnosis not present

## 2017-10-20 MED ORDER — MELOXICAM 15 MG PO TABS: 15.0000 mg | ORAL_TABLET | Freq: Every day | ORAL | 0 refills | Status: DC

## 2017-10-20 MED ORDER — PREDNISONE 5 MG PO TBEC: DELAYED_RELEASE_TABLET | ORAL | 0 refills | Status: DC

## 2017-10-20 NOTE — Patient Instructions (Signed)
Flat Feet, Adult Normally, a foot has a curve, called an arch, on its inner side. The arch creates a gap between the foot and the ground. Flat feet is a common condition in which one or both feet do not have an arch. What are the causes? This condition may be caused by:  Failure of a normal arch to develop during childhood.  An injury to tendons and ligaments in the foot, such as to the tendon that supports the arch (posterior tibial tendon).  Loose tendons or ligaments in the foot.  A wearing down of the arch over time.  Injury to bones in the foot.  An abnormality in the bones of the foot, called tarsal coalition. This happens when two or more bones in the foot are joined together (fused) before birth.  What increases the risk? This condition is more likely to develop in:  Females.  Adults age 40 or older.  People who: ? Have a family history of flat feet. ? Have a history of childhood flexible flatfoot. ? Are obese. ? Have diabetes. ? Have high blood pressure. ? Participate in high-impact sports. ? Have inflammatory arthritis. ? Have a history of broken (fractured) or dislocated bones in the foot.  What are the signs or symptoms? Symptoms of this condition include:  Pain or tightness along the bottom of the foot.  Foot pain that gets worse with activity.  Swelling of the inner side of the foot.  Swelling of the ankle.  Pain on the outer side of the ankle.  Changes in the way that you walk (gait).  Pronation. This is when the foot and ankle lean inward when you are standing.  Bony bumps on the top or inner side of the foot.  How is this diagnosed? This condition is diagnosed with a physical exam of your foot and ankle. Your health care provider may also:  Look at your shoes for patterns of wear on the soles.  Order imaging tests, such as X-rays, a CT scan, or an MRI.  Refer you to a health care provider who specializes in feet (podiatrist) or a physical  therapist.  How is this treated? This condition may be treated with:  Stretching exercises or physical therapy. This helps to increase range of motion and relieve pain.  A shoe insert (orthotic). This helps to support the arch of your foot. Orthotics can be purchased from a store or can be custom-made by your health care provider.  Wearing shoes with appropriate arch support. This is especially important for athletes.  Medicines. These may be prescribed to relieve pain.  An ankle brace, boot, or cast. These may be used to relieve pressure on your foot. You may be given crutches if walking is painful.  Surgery. This may be done to improve the alignment of your foot. This is only needed if your posterior tibial tendon is torn or if you have tarsal coalition.  Follow these instructions at home: Activity  Do any exercises as told by your health care provider.  If an activity causes pain, avoid it or try to find another activity that does not cause pain. General instructions  Wear orthotics and appropriate shoes as told by your health care provider.  Take over-the-counter and prescription medicines only as told by your health care provider.  Wear an ankle brace, boot, or cast as told by your health care provider.  Use crutches as told by your health care provider.  Keep all follow-up visits as told   by your health care provider. This is important. How is this prevented? To prevent the condition from getting worse:  Wear comfortable, supportive shoes that are appropriate for your activities.  Maintain a healthy weight.  Stay active in a way that your health care provider recommends. This will help to keep your feet flexible and strong.  Manage long-term (chronic) health conditions, such as diabetes, high blood pressure, and inflammatory arthritis.  Work with a health care provider if you have concerns about your feet or shoes.  Contact a health care provider if:  You have  pain in your foot or lower leg that gets worse or does not improve with medicine.  You have pain or difficulty when walking.  You have problems with your orthotics. Summary  Flat feet is a common condition in which one or both feet do not have a curve, called an arch, on the inner side.  Your health care provider may recommend a shoe insert (orthotic) or shoes with the appropriate arch support.  Other treatments may include stretching exercises or physical therapy, medicines to relieve pain, and wearing an ankle brace, boot, or cast.  Surgery may be done if you have a tear in the tendon that supports your arch (posterior tibial tendon) or if two or more of your foot bones were joined together (fused)  before birth (tarsal coalition). This information is not intended to replace advice given to you by your health care provider. Make sure you discuss any questions you have with your health care provider. Document Released: 08/21/2009 Document Revised: 01/04/2017 Document Reviewed: 01/04/2017 [employer] Interactive Patient Education  2018 [employer]  

## 2017-10-20 NOTE — Progress Notes (Signed)
Subjective: Stuart Martinez is a 26 y.o. male patient who presents to office for evaluation of Right> Left foot pain. Patient complains of progressive pain especially over the last year in the Right>Left foot that starts as fatigue in the medial arch and hurts at heels and at toes where he gets the callus at. 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  Patient has also tried changing shoes for feet which made feet more painful in the past. Patient denies any other pedal complaints.   Review of systems all systems reviewed and are negative  There are no active problems to display for this patient.  Current Outpatient Medications on File Prior to Visit  Medication Sig Dispense Refill  . albuterol (PROVENTIL HFA;VENTOLIN HFA) 108 (90 BASE) MCG/ACT inhaler Inhale 1-2 puffs into the lungs every 6 (six) hours as needed for wheezing or shortness of breath. 1 Inhaler 0  . naproxen (NAPROSYN) 500 MG tablet Take 1 tablet (500 mg total) by mouth 2 (two) times daily with a meal. 30 tablet 0  . acetaminophen (TYLENOL) 500 MG tablet Take 1,000 mg by mouth every 6 (six) hours as needed. For pain or fever    . augmented betamethasone dipropionate (DIPROLENE AF) 0.05 % cream Apply topically 2 (two) times daily. (Patient not taking: Reported on 10/20/2017) 50 g 1  . famotidine (PEPCID) 20 MG tablet Take 1 tablet (20 mg total) by mouth 2 (two) times daily. X 14 days (Patient not taking: Reported on 10/20/2017) 30 tablet 0  . HYDROcodone-acetaminophen (VICODIN) 5-500 MG per tablet Take 1 tablet by mouth every 6 (six) hours as needed for pain. (Patient not taking: Reported on 10/20/2017) 30 tablet 0  . methocarbamol (ROBAXIN) 500 MG tablet Take 1 tablet (500 mg total) by mouth 2 (two) times daily. (Patient not taking: Reported on 10/20/2017) 20 tablet 0   No current  facility-administered medications on file prior to visit.    No Known Allergies   Objective:  General: Alert and oriented x3 in no acute distress  Dermatology: Mild pinch calluses plantar hallux IPJ bilateral, no open lesions bilateral lower extremities, no webspace macerations, no ecchymosis bilateral, all nails x 10 are well manicured.  Vascular: Dorsalis Pedis and Posterior Tibial pedal pulses 2/4, Capillary Fill Time 3 seconds, (+) pedal hair growth bilateral, no edema bilateral lower extremities, Temperature gradient within normal limits.  Neurology: Gross sensation intact via light touch bilateral.  Musculoskeletal: Moderate tenderness with palpation along medial arch, medial fascial band on Right>Left, mild tenderness along Posterior tibial tendon course with medial soft tissue buldge noted, Achilles insertion and plantar fascial insertion, there is decreased ankle rom with knee extending  vs flexed resembling gastroc equnius bilateral, Subtalar joint range of motion is within normal limits, there is no 1st ray hypermobility noted bilateral, decreased 1st MPJ rom Right>Left with functional limitus noted on weightbearing exam, there is medial arch collapse Right> Left on weightbearing exam,slight RF valgus Right> Left, no "too-many toes" sign appreciated, unable to perform heel rise test due to pain in right>left arch.    Xrays Right/Left foot:  Normal osseous mineralization. Joint spaces preserved. No fracture/dislocation/boney destruction.  Bone spur at insertion of the Achilles suggestive of enthesis, mild 1st ray elevatus present. Increased Talar head uncovering present. Anterior break in cyma line with midtarsal breach present. Increased Talar declination present. Decreased calcaneal inclination  present.  No soft tissue abnormalities or radiopaque foreign bodies.   Assessment and Plan: Problem List Items Addressed This Visit    None    Visit Diagnoses    Pes planus of both feet     -  Primary   Relevant Medications   PredniSONE (RAYOS) 5 MG TBEC   Other Relevant Orders   DG Foot Complete Right   DG Foot Complete Left   Achilles tendonitis, bilateral       Relevant Medications   PredniSONE (RAYOS) 5 MG TBEC   Plantar fasciitis       Relevant Medications   PredniSONE (RAYOS) 5 MG TBEC   Acquired tight Achilles tendon, unspecified laterality       Relevant Medications   PredniSONE (RAYOS) 5 MG TBEC      -Complete examination performed -Xrays reviewed -Discussed treatement options; discussed pes planus deformity, tendinitis, fasciitis, equinus;conservative and  surgical including but not limited to the use of bone grafts, bone cuts, tendon lengthening/reconstruction to achieve improvement in flatfoot deformity on Right>Left foot -Rx prednisone tablet for inflammation once completed to take meloxicam as instructed -Recommend gentle stretching exercises and rolling pin to Calf -Office will check orthotic coverage and notify patient on coverage options at next visit -Meanwhile continue with good supportive shoes and self callus care -Patient to return in 4 weeks or sooner if condition worsens.  Asencion Islamitorya Deniel Mcquiston, DPM

## 2017-11-17 ENCOUNTER — Ambulatory Visit: Payer: 59 | Admitting: Sports Medicine

## 2017-12-14 ENCOUNTER — Ambulatory Visit: Payer: 59 | Admitting: Sports Medicine

## 2017-12-28 ENCOUNTER — Ambulatory Visit: Payer: 59 | Admitting: Sports Medicine

## 2018-01-01 ENCOUNTER — Other Ambulatory Visit: Payer: Self-pay | Admitting: Sports Medicine

## 2018-01-01 DIAGNOSIS — M2142 Flat foot [pes planus] (acquired), left foot: Principal | ICD-10-CM

## 2018-01-01 DIAGNOSIS — M7661 Achilles tendinitis, right leg: Secondary | ICD-10-CM

## 2018-01-01 DIAGNOSIS — M2141 Flat foot [pes planus] (acquired), right foot: Secondary | ICD-10-CM

## 2018-01-01 DIAGNOSIS — M7662 Achilles tendinitis, left leg: Secondary | ICD-10-CM

## 2023-06-19 ENCOUNTER — Ambulatory Visit: Payer: 59 | Admitting: Diagnostic Neuroimaging

## 2023-06-19 ENCOUNTER — Encounter: Payer: Self-pay | Admitting: Diagnostic Neuroimaging

## 2023-06-19 VITALS — BP 132/84 | HR 71 | Ht 65.0 in | Wt 253.0 lb

## 2023-06-19 DIAGNOSIS — R200 Anesthesia of skin: Secondary | ICD-10-CM | POA: Diagnosis not present

## 2023-06-19 MED ORDER — CYCLOBENZAPRINE HCL 5 MG PO TABS: 5.0000 mg | ORAL_TABLET | Freq: Every evening | ORAL | 1 refills | Status: AC | PRN

## 2023-06-19 NOTE — Patient Instructions (Signed)
  LEFT LATERAL THIGH NUMBNESS / PAIN (meralgia paresthetica vs L5 radiculopathy) - start exercise program to improve hip mobility / flexibility  - cyclobenzaprine as needed at bedtime for muscle spasm / pain - if not improving in 4-6 weeks; then consider MRI lumbar spine

## 2023-06-19 NOTE — Progress Notes (Signed)
[facility]  PATIENT: Stuart Martinez DOB: 09/20/1991  REFERRING CLINICIAN: Lance Bosch, NP HISTORY FROM: patient  REASON FOR VISIT: new consult    HISTORICAL  CHIEF COMPLAINT:  Chief Complaint  Patient presents with   New Patient (Initial Visit)    Rm 6, here alone  Pt is here for left leg numbness/tingling that started 08/2022, states it happens when sitting or laying down.     HISTORY OF PRESENT ILLNESS:   32 year old male here for evaluation of left anterolateral thigh numbness.  Symptoms started around October 2023.  He was helping move a refrigerator with a friend when he lifted and felt a pop sensation in his lower back.  He immediately had pain in his lower back and left leg pain and weakness.  He rested for a few days use conservative therapies, and eventually symptoms improved.  Now he has some intermittent numbness and discomfort in his left anterolateral thigh, especially when he is laying down on his left side.  Patient continues to be able to work and exercise.  He does have some mild tightness in his left leg when he exercise.  No weakness.  No right leg problems.  No problems with arms or hands.  No problems with face speech vision or swallowing.   REVIEW OF SYSTEMS: Full 14 system review of systems performed and negative with exception of: as per HPI.  ALLERGIES: No Known Allergies  HOME MEDICATIONS: Outpatient Medications Prior to Visit  Medication Sig Dispense Refill   Multiple Vitamin (MULTIVITAMIN) tablet Take 1 tablet by mouth daily.     Omega-3 Fatty Acids (FISH OIL) 1000 MG CAPS Take by mouth.     albuterol (PROVENTIL HFA;VENTOLIN HFA) 108 (90 BASE) MCG/ACT inhaler Inhale 1-2 puffs into the lungs every 6 (six) hours as needed for wheezing or shortness of breath. (Patient not taking: Reported on 06/19/2023) 1 Inhaler 0   acetaminophen (TYLENOL) 500 MG tablet Take 1,000 mg by mouth every 6 (six) hours as needed. For pain or fever      augmented betamethasone dipropionate (DIPROLENE AF) 0.05 % cream Apply topically 2 (two) times daily. (Patient not taking: Reported on 10/20/2017) 50 g 1   famotidine (PEPCID) 20 MG tablet Take 1 tablet (20 mg total) by mouth 2 (two) times daily. X 14 days (Patient not taking: Reported on 10/20/2017) 30 tablet 0   HYDROcodone-acetaminophen (VICODIN) 5-500 MG per tablet Take 1 tablet by mouth every 6 (six) hours as needed for pain. (Patient not taking: Reported on 10/20/2017) 30 tablet 0   meloxicam (MOBIC) 15 MG tablet Take 1 tablet (15 mg total) by mouth daily. 30 tablet 0   methocarbamol (ROBAXIN) 500 MG tablet Take 1 tablet (500 mg total) by mouth 2 (two) times daily. (Patient not taking: Reported on 10/20/2017) 20 tablet 0   naproxen (NAPROSYN) 500 MG tablet Take 1 tablet (500 mg total) by mouth 2 (two) times daily with a meal. 30 tablet 0   PredniSONE (RAYOS) 5 MG TBEC Day 1 take 6 tablets Day 2 take 5 tablets Day 3 take 4 tablets Day 4 take 3 tablets Day 5 take 2 tablets Day 6 take 1 tablet 30 tablet 0   No facility-administered medications prior to visit.    PAST MEDICAL HISTORY: History reviewed. No pertinent past medical history.  PAST SURGICAL HISTORY: Past Surgical History:  Procedure Laterality Date   right wrist surgery      FAMILY HISTORY: Family History  Problem Relation Age of Onset  Hypertension Mother    Hypertension Father    Hypertension Brother     SOCIAL HISTORY: Social History   Socioeconomic History   Marital status: Married    Spouse name: Not on file   Number of children: Not on file   Years of education: Not on file   Highest education level: Not on file  Occupational History   Not on file  Tobacco Use   Smoking status: Never   Smokeless tobacco: Never  Substance and Sexual Activity   Alcohol use: No   Drug use: No   Sexual activity: Yes  Other Topics Concern   Not on file  Social History Narrative   Not on file   Social Determinants of  Health   Financial Resource Strain: Not on file  Food Insecurity: Not on file  Transportation Needs: Not on file  Physical Activity: Not on file  Stress: Not on file  Social Connections: Not on file  Intimate Partner Violence: Not on file     PHYSICAL EXAM  GENERAL EXAM/CONSTITUTIONAL: Vitals:  Vitals:   06/19/23 1125  BP: 132/84  Pulse: 71  Weight: 253 lb (114.8 kg)  Height: 5\' 5"  (1.651 m)   Body mass index is 42.1 kg/m. Wt Readings from Last 3 Encounters:  06/19/23 253 lb (114.8 kg)  09/28/11 181 lb (82.1 kg)   Patient is in no distress; well developed, nourished and groomed; neck is supple  CARDIOVASCULAR: Examination of carotid arteries is normal; no carotid bruits Regular rate and rhythm, no murmurs Examination of peripheral vascular system by observation and palpation is normal  EYES: Ophthalmoscopic exam of optic discs and posterior segments is normal; no papilledema or hemorrhages No results found.  MUSCULOSKELETAL: Gait, strength, tone, movements noted in Neurologic exam below  NEUROLOGIC: MENTAL STATUS:      No data to display         awake, alert, oriented to person, place and time recent and remote memory intact normal attention and concentration language fluent, comprehension intact, naming intact fund of knowledge appropriate  CRANIAL NERVE:  2nd - no papilledema on fundoscopic exam 2nd, 3rd, 4th, 6th - pupils equal and reactive to light, visual fields full to confrontation, extraocular muscles intact, no nystagmus 5th - facial sensation symmetric 7th - facial strength symmetric 8th - hearing intact 9th - palate elevates symmetrically, uvula midline 11th - shoulder shrug symmetric 12th - tongue protrusion midline  MOTOR:  normal bulk and tone, full strength in the BUE, BLE  SENSORY:  normal and symmetric to light touch, temperature, vibration  COORDINATION:  finger-nose-finger, fine finger movements normal  REFLEXES:  deep  tendon reflexes 1+ and symmetric  GAIT/STATION:  narrow based gait     DIAGNOSTIC DATA (LABS, IMAGING, TESTING) - I reviewed patient records, labs, notes, testing and imaging myself where available.  Lab Results  Component Value Date   WBC 7.1 09/08/2012   HGB 14.9 09/08/2012   HCT 41.8 09/08/2012   MCV 85.5 09/08/2012   PLT 177 09/08/2012      Component Value Date/Time   NA 138 09/08/2012 2130   K 3.5 09/08/2012 2130   CL 102 09/08/2012 2130   CO2 25 09/08/2012 2130   GLUCOSE 93 09/08/2012 2130   BUN 11 09/08/2012 2130   CREATININE 0.93 09/08/2012 2130   CALCIUM 9.7 09/08/2012 2130   GFRNONAA >90 09/08/2012 2130   GFRAA >90 09/08/2012 2130   No results found for: "CHOL", "HDL", "LDLCALC", "LDLDIRECT", "TRIG", "CHOLHDL" No results found  for: "HGBA1C" No results found for: "VITAMINB12" No results found for: "TSH"     ASSESSMENT AND PLAN  32 y.o. year old male here with:   Dx:  1. Numbness of left anterior thigh     PLAN:  LEFT LATERAL THIGH NUMBNESS / PAIN (meralgia paresthetica vs L5 radiculopathy) - start exercise program to improve hip mobility / flexibility; reviewed exercise programming and nutrition - cyclobenzaprine as needed at bedtime for muscle spasm / pain - if not improving in 4-6 weeks; then consider MRI lumbar spine  Meds ordered this encounter  Medications   cyclobenzaprine (FLEXERIL) 5 MG tablet    Sig: Take 1 tablet (5 mg total) by mouth at bedtime as needed for muscle spasms.    Dispense:  30 tablet    Refill:  1   Return for pending if symptoms worsen or fail to improve, return to PCP.    Suanne Marker, MD 06/19/2023, 12:10 PM Certified in Neurology, Neurophysiology and Neuroimaging  [facility] [address], [address] [place], [place] [address] [phone]
